# Patient Record
Sex: Male | Born: 1965 | Hispanic: No | Marital: Married | Smoking: Former smoker
Health system: Southern US, Community
[De-identification: ages and names within clinical notes are randomized; demographics above are authoritative.]

## PROBLEM LIST (undated history)

## (undated) HISTORY — PX: FINGER SURGERY: SHX640

## (undated) HISTORY — PX: APPENDECTOMY: SHX54

## (undated) HISTORY — PX: KNEE ARTHROSCOPY: SHX127

## (undated) HISTORY — PX: TONSILLECTOMY: SUR1361

## (undated) HISTORY — PX: EYE SURGERY: SHX253

---

## 2000-06-22 ENCOUNTER — Encounter: Payer: Self-pay | Admitting: Neurology

## 2000-06-22 ENCOUNTER — Encounter: Admission: RE | Admit: 2000-06-22 | Discharge: 2000-06-22 | Payer: Self-pay | Admitting: Neurology

## 2015-08-07 ENCOUNTER — Encounter (HOSPITAL_COMMUNITY): Payer: Self-pay | Admitting: Emergency Medicine

## 2015-08-07 ENCOUNTER — Emergency Department (HOSPITAL_COMMUNITY): Payer: BC Managed Care – PPO

## 2015-08-07 ENCOUNTER — Emergency Department (HOSPITAL_COMMUNITY)
Admission: EM | Admit: 2015-08-07 | Discharge: 2015-08-07 | Disposition: A | Discharge status: Home / Self Care | Payer: BC Managed Care – PPO | Attending: Emergency Medicine | Admitting: Emergency Medicine

## 2015-08-07 DIAGNOSIS — Y9289 Other specified places as the place of occurrence of the external cause: Secondary | ICD-10-CM | POA: Insufficient documentation

## 2015-08-07 DIAGNOSIS — S6992XA Unspecified injury of left wrist, hand and finger(s), initial encounter: Secondary | ICD-10-CM | POA: Diagnosis present

## 2015-08-07 DIAGNOSIS — Y288XXA Contact with other sharp object, undetermined intent, initial encounter: Secondary | ICD-10-CM | POA: Diagnosis not present

## 2015-08-07 DIAGNOSIS — Y998 Other external cause status: Secondary | ICD-10-CM | POA: Insufficient documentation

## 2015-08-07 DIAGNOSIS — S62639B Displaced fracture of distal phalanx of unspecified finger, initial encounter for open fracture: Secondary | ICD-10-CM

## 2015-08-07 DIAGNOSIS — S62631B Displaced fracture of distal phalanx of left index finger, initial encounter for open fracture: Secondary | ICD-10-CM | POA: Insufficient documentation

## 2015-08-07 DIAGNOSIS — Y9389 Activity, other specified: Secondary | ICD-10-CM | POA: Diagnosis not present

## 2015-08-07 MED ORDER — CEPHALEXIN 500 MG PO CAPS: 500.0000 mg | ORAL_CAPSULE | Freq: Three times a day (TID) | ORAL | Status: AC

## 2015-08-07 MED ORDER — TRAMADOL HCL 50 MG PO TABS: 50.0000 mg | ORAL_TABLET | Freq: Four times a day (QID) | ORAL | Status: AC | PRN

## 2015-08-07 MED ORDER — LIDOCAINE HCL (PF) 1 % IJ SOLN
5.0000 mL | Freq: Once | INTRAMUSCULAR | Status: DC
  Filled 2015-08-07: qty 5

## 2015-08-07 MED ORDER — ONDANSETRON HCL 4 MG/2ML IJ SOLN
4.0000 mg | Freq: Once | INTRAMUSCULAR | Status: AC
  Administered 2015-08-07: 4 mg via INTRAVENOUS
  Filled 2015-08-07: qty 2

## 2015-08-07 MED ORDER — MORPHINE SULFATE (PF) 4 MG/ML IV SOLN
4.0000 mg | Freq: Once | INTRAVENOUS | Status: AC
Start: 2015-08-07 — End: 2015-08-07
  Administered 2015-08-07: 4 mg via INTRAVENOUS
  Filled 2015-08-07: qty 1

## 2015-08-07 MED ORDER — LIDOCAINE HCL 2 % IJ SOLN
INTRAMUSCULAR | Status: AC
  Filled 2015-08-07: qty 20

## 2015-08-07 MED ORDER — CEFAZOLIN SODIUM-DEXTROSE 2-4 GM/100ML-% IV SOLN
2.0000 g | Freq: Once | INTRAVENOUS | Status: AC
  Administered 2015-08-07: 2 g via INTRAVENOUS
  Filled 2015-08-07: qty 100

## 2015-08-07 NOTE — Discharge Instructions (Addendum)
Discharge Instructions   Move your fingers as much as possible, making a full fist and fully opening the fist. Elevate your hand to reduce pain & swelling of the digits.  Ice over the operative site may be helpful to reduce pain & swelling.  DO NOT USE HEAT. Pain medicine has been prescribed for you.  Use your medicine as needed over the first 48 hours, and then you can begin to taper your use.  You may use Tylenol in place of your prescribed pain medication, but not IN ADDITION to it. Leave the dressing in place until you return to our office.  You may shower, but keep the bandage clean & dry.  You may drive a car when you are off of prescription pain medications and can safely control your vehicle with both hands. Our office will call you to arrange follow-up   Please call [phone] during normal business hours or [phone] after hours for any problems. Including the following:  - excessive redness of the incisions - drainage for more than 4 days - fever of more than 101.5 F  *Please note that pain medications will not be refilled after hours or on weekends.  Pain Control You can use Ibuprofen 400mg  combined with Tylenol 1000mg  for pain relief every 6 hours. Do not exceed 4g of Tylenol in one 24 hour period.

## 2015-08-07 NOTE — ED Provider Notes (Signed)
CSN: [id number]     Arrival date & time 08/07/15  1719 History   First MD Initiated Contact with Patient 08/07/15 1727     Chief Complaint  Patient presents with  . Extremity Laceration   (Consider location/radiation/quality/duration/timing/severity/associated sxs/prior Treatment) HPI 50 y.o. male presents to the Emergency Department today complaining of left hand pain. States that 30 min ago he was cutting wood with a circular saw and the blade kicked. Suffered laceration to 2nd digit of left hand. Pt is not on any blood thinners. Pain is 9/10. Throbbing. No OTC remedies. No fevers. No N/V. No other symptoms noted. Tetanus UTD 3 years ago.     No past medical history on file. No past surgical history on file. No family history on file. Social History  Substance Use Topics  . Smoking status: Not on file  . Smokeless tobacco: Not on file  . Alcohol Use: Not on file    Review of Systems ROS reviewed and all are negative for acute change except as noted in the HPI.  Allergies  Review of patient's allergies indicates not on file.  Home Medications   Prior to Admission medications   Not on File   There were no vitals taken for this visit. Physical Exam  Constitutional: He is oriented to person, place, and time. He appears well-developed and well-nourished.  HENT:  Head: Normocephalic and atraumatic.  Eyes: EOM are normal.  Cardiovascular: Normal rate and regular rhythm.   Pulmonary/Chest: Effort normal.  Abdominal: Soft.  Musculoskeletal: Normal range of motion.  Left 2nd Digit. Obvious laceration noted on distal phalanx. TTP. Cap refill intact   Neurological: He is alert and oriented to person, place, and time.  Skin: Skin is warm and dry.  Psychiatric: He has a normal mood and affect. His behavior is normal. Thought content normal.  Nursing note and vitals reviewed.    ED Course  .Nerve Block Date/Time: 08/07/2015 6:28 PM Performed by: Audry PiliMOHR, Kadeja Granada Authorized by: Audry PiliMOHR,  [place] Oborn Consent: Verbal consent obtained. Patient understanding: patient states understanding of the procedure being performed Patient consent: the patient's understanding of the procedure matches consent given Patient identity confirmed: verbally with patient and arm band Indications: pain relief Body area: upper extremity Nerve: digital Laterality: left Patient position: sitting Needle gauge: 25 G Location technique: anatomical landmarks Local anesthetic: lidocaine 1% without epinephrine Anesthetic total: 5 ml Outcome: pain improved Patient tolerance: Patient tolerated the procedure well with no immediate complications   (including critical care time) Labs Review Labs Reviewed - No data to display  Imaging Review Dg Finger Index Left  08/07/2015  CLINICAL DATA:  Laceration to distal tuft of index finger today with table saw it at work. EXAM: LEFT INDEX FINGER 2+V COMPARISON:  None. FINDINGS: Examination demonstrates a displaced chip fracture over the distal aspect of the second distal phalanx with adjacent soft tissue defect compatible with laceration. IMPRESSION: Displaced chip fracture of the distal aspect of the second distal phalanx. Electronically Signed   By: Elberta Fortisaniel  Boyle M.D.   On: 08/07/2015 17:53   I have personally reviewed and evaluated these images and lab results as part of my medical decision-making.   EKG Interpretation None     MDM  I have reviewed and evaluated the relevant imaging studies. I have reviewed the relevant previous healthcare records. I obtained HPI from historian. Patient discussed with supervising physician  ED Course:  Assessment: Pt is a 49yM who presents with left hand pain after using circular saw x30  min ago. On exam, pt in NAD. Nontoxic/nonseptic appearing. VSS. Afebrile. Lungs CTA. Heart RRR. Abdomen nontender soft. Laceration ntoed on distal phalanx of left hand 2nd digit. Laceratio through cuticle. No avulsion. Cap refill intact. Bleeding  controlled.. Imaging showed left Distal Chip Fx of Distal Phalanx 2nd Digit hand surgery notified and repaired injury in ED. Will follow up in clinic within the week. Given analgesia in ED. Digital Block performed. Plan is to DC home with follow up to Dr. Janee Morn. At time of discharge, Patient is in no acute distress. Vital Signs are stable. Patient is able to ambulate. Patient able to tolerate PO.    Disposition/Plan:  DC Home Additional Verbal discharge instructions given and discussed with patient.  Pt Instructed to f/u with Hand Surgery for evaluation and treatment of symptoms. Return precautions given Pt acknowledges and agrees with plan  Supervising Physician Cathren Laine, MD   Final diagnoses:  Fracture of distal phalanx of finger, open, initial encounter          Audry Pili, PA-C 08/07/15 1918  Cathren Laine, MD 08/07/15 2315

## 2015-08-07 NOTE — Consult Note (Signed)
ORTHOPAEDIC CONSULTATION HISTORY & PHYSICAL REQUESTING PHYSICIAN: Cathren Laine, MD  Chief Complaint: left index finger tablesaw injury  HPI: Vincent Wilson is a 50 y.o. male who was working this afternoon when a board kicked back.  His left index fingertip was injured.  He has a history of severe injury to 3 of the digits on the right hand 17 years ago which has been reconstructed and works fairly well.  States last tetanus was 3 years ago.  History reviewed. No pertinent past medical history. Past Surgical History  Procedure Laterality Date  . Finger surgery    . Knee arthroscopy Left   . Eye surgery Right   . Appendectomy    . Tonsillectomy     Social History   Social History  . Marital Status: Married    Spouse Name: N/A  . Number of Children: N/A  . Years of Education: N/A   Social History Main Topics  . Smoking status: Former Smoker    Types: Cigarettes    Quit date: 07/31/2015  . Smokeless tobacco: None  . Alcohol Use: No  . Drug Use: No  . Sexual Activity: Not Asked   Other Topics Concern  . None   Social History Narrative  . None   No family history on file. No Known Allergies Prior to Admission medications   Medication Sig Start Date End Date Taking? Authorizing Provider  cephALEXin (KEFLEX) 500 MG capsule Take 1 capsule (500 mg total) by mouth 3 (three) times daily. 08/07/15   Mack Hook, MD  traMADol (ULTRAM) 50 MG tablet Take 1-2 tablets (50-100 mg total) by mouth every 6 (six) hours as needed (max 8 tablets daily). 08/07/15   Mack Hook, MD   Dg Finger Index Left  08/07/2015  CLINICAL DATA:  Laceration to distal tuft of index finger today with table saw it at work. EXAM: LEFT INDEX FINGER 2+V COMPARISON:  None. FINDINGS: Examination demonstrates a displaced chip fracture over the distal aspect of the second distal phalanx with adjacent soft tissue defect compatible with laceration. IMPRESSION: Displaced chip fracture of the distal aspect of the second  distal phalanx. Electronically Signed   By: Elberta Fortis M.D.   On: 08/07/2015 17:53    Positive ROS: All other systems have been reviewed and were otherwise negative with the exception of those mentioned in the HPI and as above.  Physical Exam: Vitals: Refer to EMR. Constitutional:  WD, WN, NAD HEENT:  NCAT, EOMI Neuro/Psych:  Alert & oriented to person, place, and time; appropriate mood & affect Lymphatic: No generalized extremity edema or lymphadenopathy Extremities / MSK:  The extremities are normal with respect to appearance, ranges of motion, joint stability, muscle strength/tone, sensation, & perfusion except as otherwise noted:  Left index fingertip injured, with an oblique laceration traversing from distal ulnar to proximal radial, as a parasagittal injury.  It involves the distal half of the sterile matrix leaving an intact radial sided hinge.  No significant exposed bone.  Some of the nail plate is missing, a large portion remains.  Flexor and extensor tendons intact.  Assessment: Left index finger tip injury from table saw  Plan: Digital block was instilled plain lidocaine.  Tourniquet was by the base of the digit and the wound copiously irrigated under running water.  The exact character injury could then be better ascertained.  The digit was prepped with Betadine and draped.  Nail plate was removed on the flap, as well as a portion proximally to expose the nailbed  laceration.  The skin laceration which was less than 1.5 cm was repaired with 4-0 Vicryl Rapide sutures in the nailbed with 6-0 chromic.  Digital dressing was applied and the tourniquet was removed.  He'll receive antibiotics in the ER, and discharged with analgesics and antibiotics, follow up in the office this week.  Vincent Astersavid A. Janee Mornhompson, MD      Orthopaedic & Hand Surgery [facility] [address] [place], [place]  [id number] Office: [phone] Mobile: [phone]  08/07/2015,  7:10 PM

## 2015-08-07 NOTE — ED Notes (Signed)
Pt from home after cutting his L second digit with a circular saw. The laceration appears to have gone through the end of the finger to the bottom of the nail. Wound has been assessed by the PA and cleaned. Bleeding is minimal at this time. Pt is A&O and in NAD

## 2018-01-01 ENCOUNTER — Other Ambulatory Visit: Payer: Self-pay | Admitting: *Deleted

## 2018-01-01 ENCOUNTER — Ambulatory Visit
Admission: RE | Admit: 2018-01-01 | Discharge: 2018-01-01 | Disposition: A | Discharge status: Home / Self Care | Payer: Self-pay | Source: Ambulatory Visit | Attending: *Deleted | Admitting: *Deleted

## 2018-01-01 DIAGNOSIS — F1010 Alcohol abuse, uncomplicated: Secondary | ICD-10-CM

## 2020-02-01 IMAGING — DX DG CHEST 2V
2 series · 2 of 2 positions shown · non-contrast
Comparison: None.

CLINICAL DATA: Ethanol abuse.  Cough for 3 days

EXAM:
CHEST - 2 VIEW

[dg chest 2 view (1 of 2)]
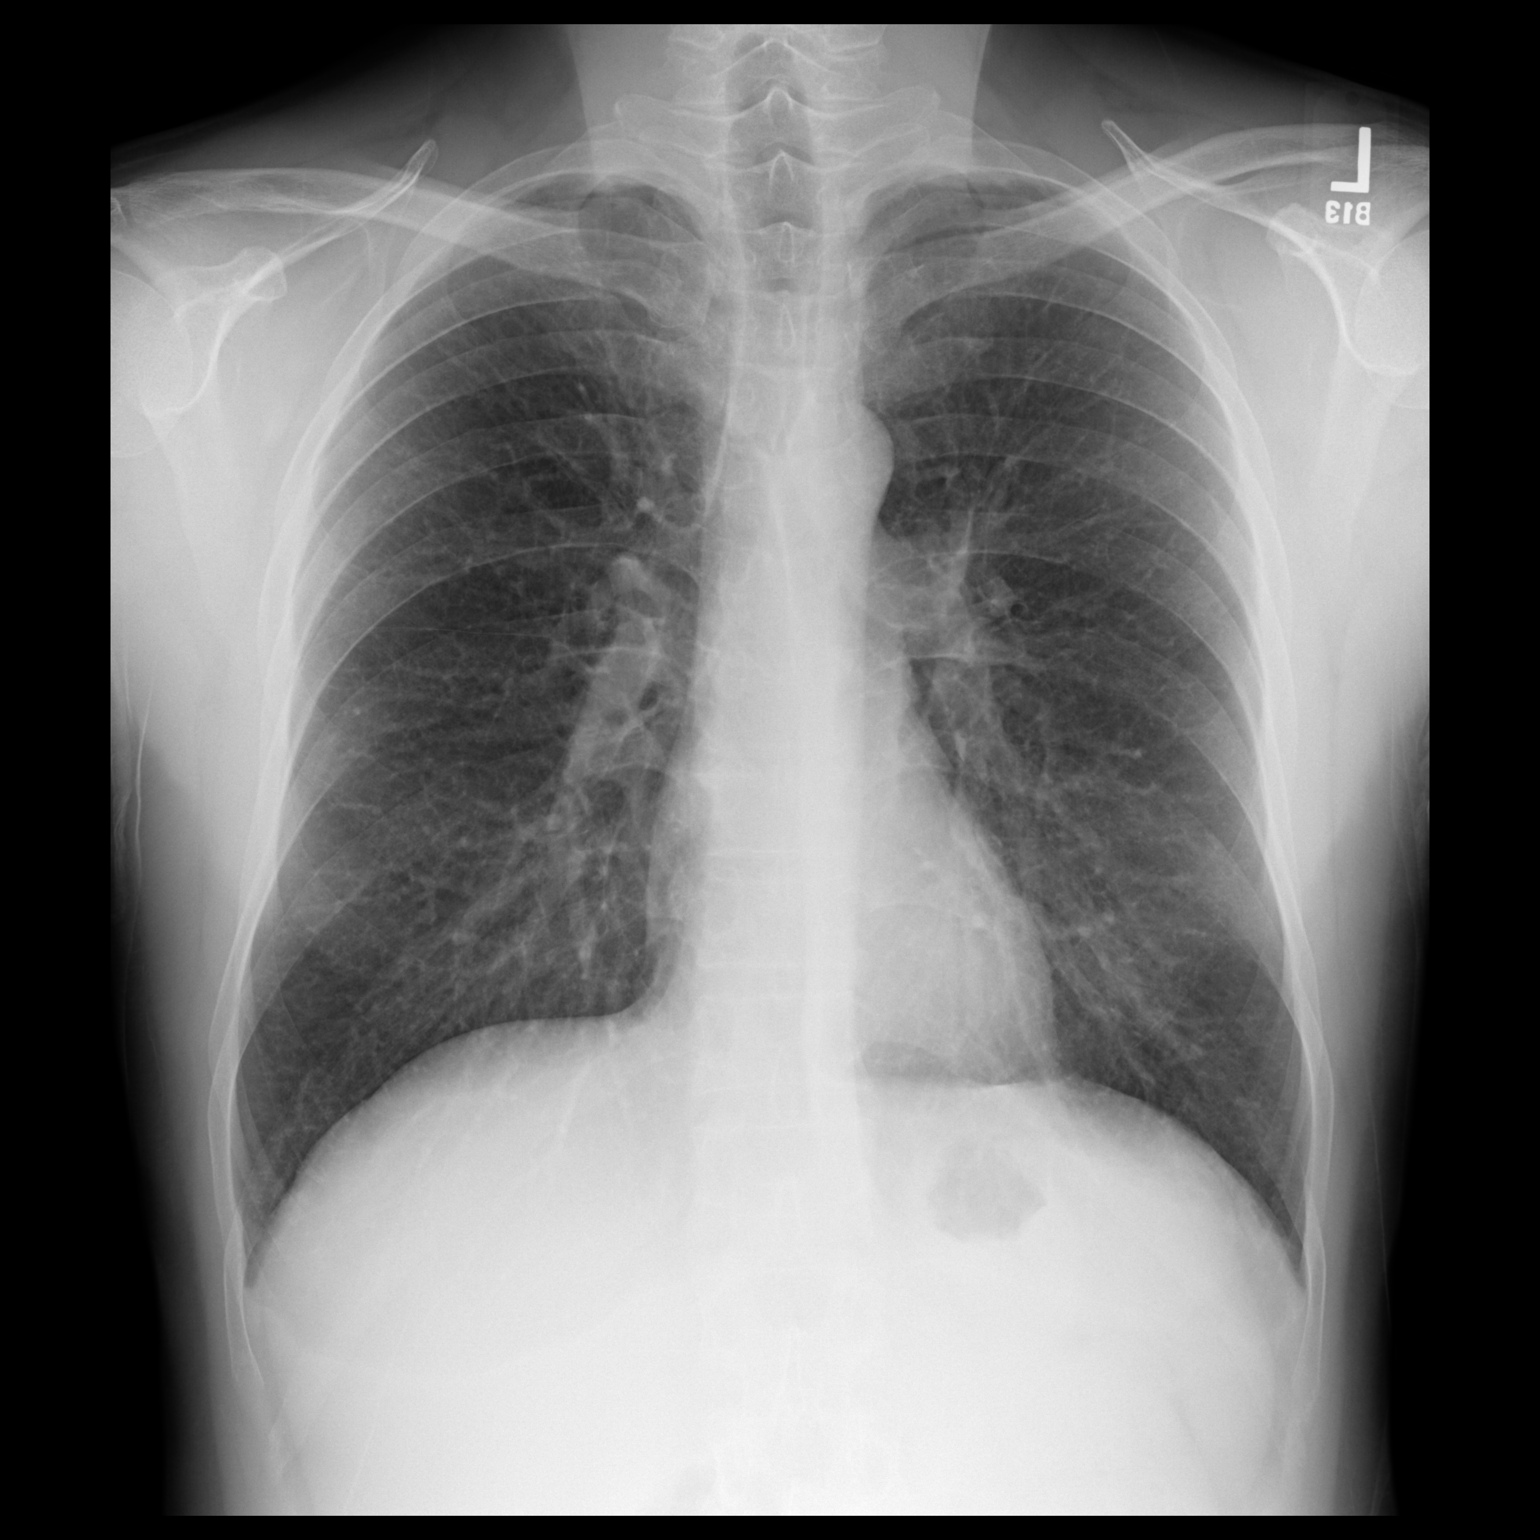

[dg chest 2 view (2 of 2)]
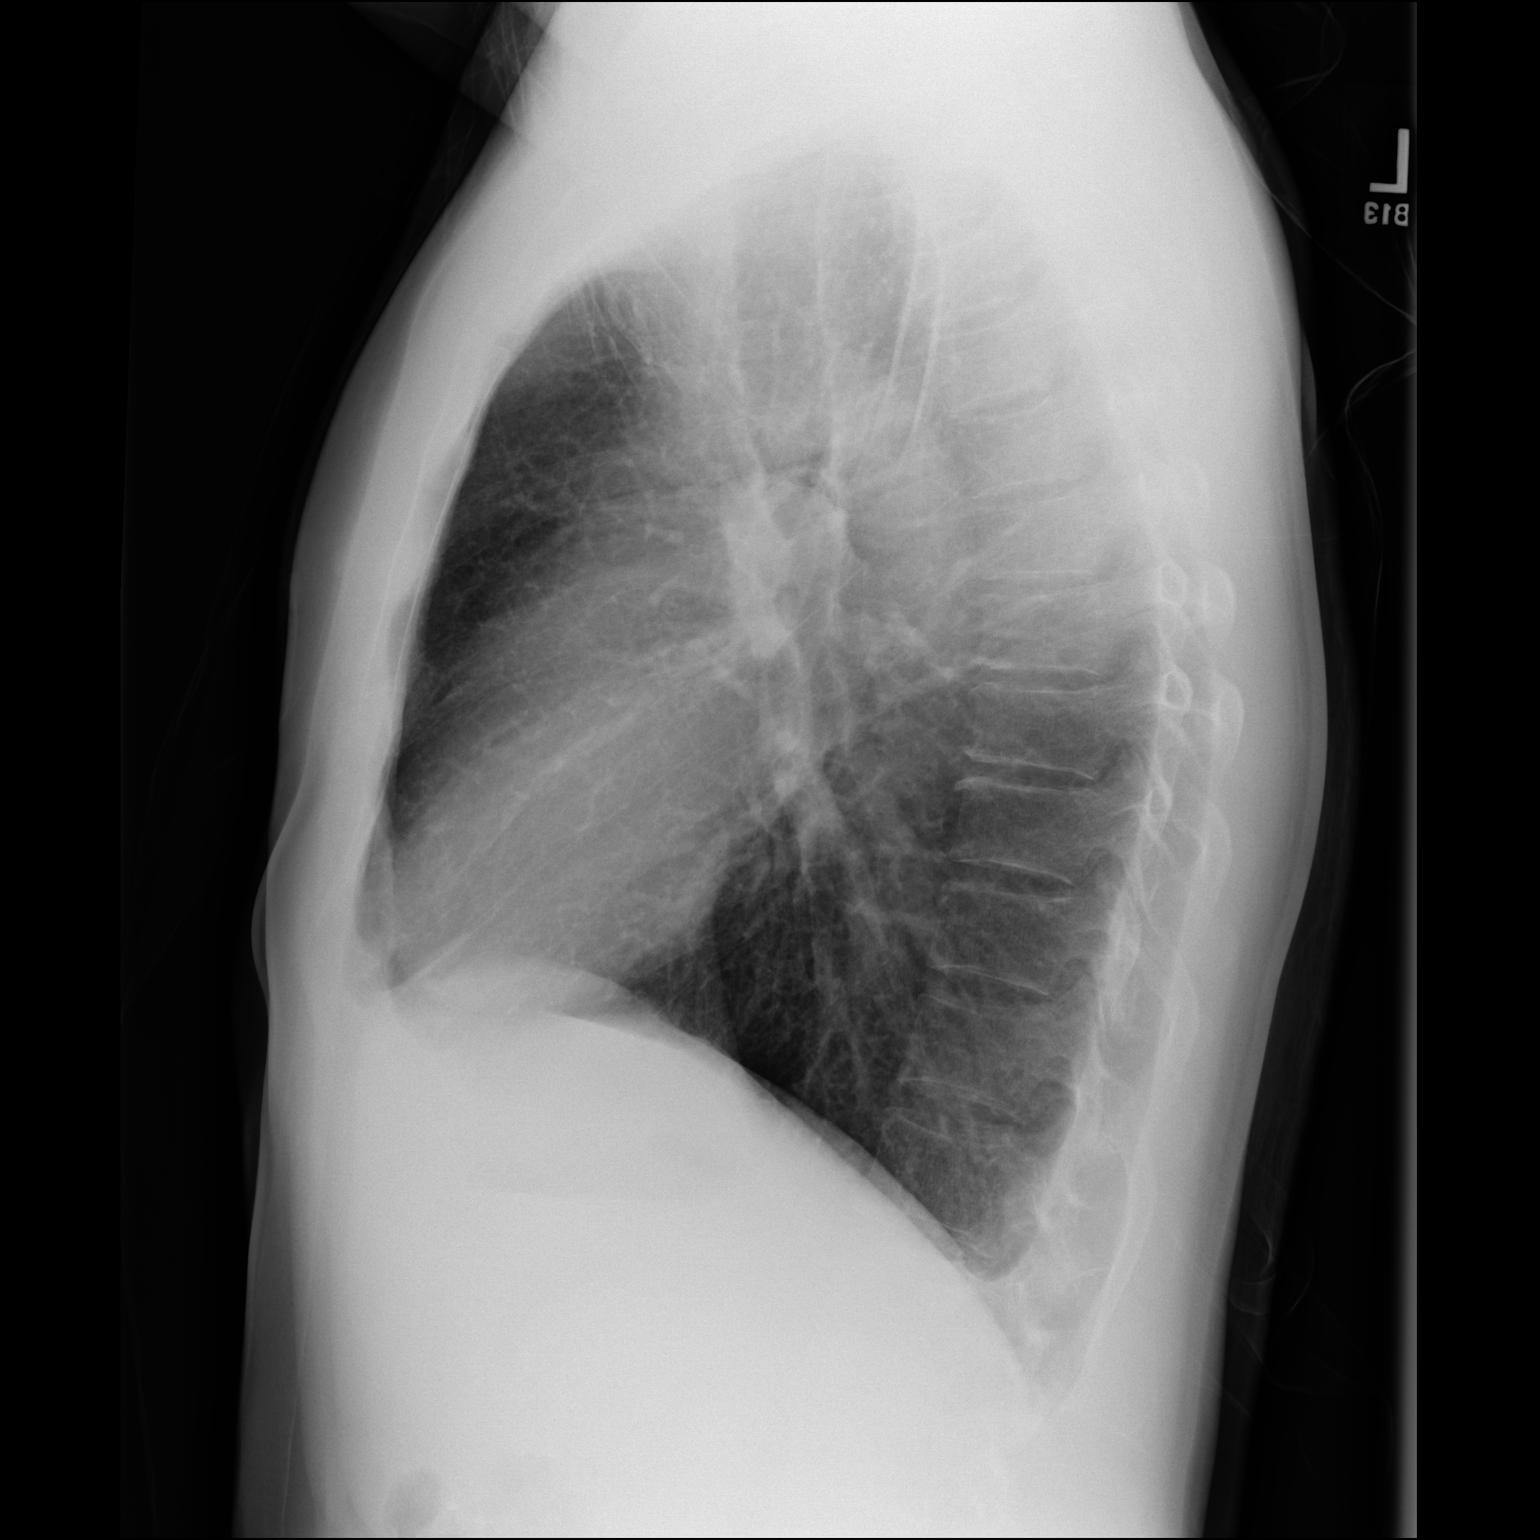

[2 of 2 positions shown; findings below may reference images not displayed]

FINDINGS: Minimal blunting of the posterior costophrenic sulcus on the right.
This could be trace fluid or scarring. No consolidation or edema. No
pneumothorax. Normal heart size and mediastinal contours.
IMPRESSION: 1. Minimal blunting of the right posterior costophrenic sulcus could
be trace fluid or scarring.
2. The lungs are clear.
